# Patient Record
Sex: Male | Born: 1965 | Race: White | Hispanic: No | Marital: Single | Smoking: Current every day smoker
Health system: Southern US, Community
[De-identification: ages and names within clinical notes are randomized; demographics above are authoritative.]

## PROBLEM LIST (undated history)

## (undated) DIAGNOSIS — F209 Schizophrenia, unspecified: Secondary | ICD-10-CM

## (undated) DIAGNOSIS — F329 Major depressive disorder, single episode, unspecified: Secondary | ICD-10-CM

## (undated) DIAGNOSIS — F32A Depression, unspecified: Secondary | ICD-10-CM

## (undated) HISTORY — PX: WRIST SURGERY: SHX841

---

## 2012-09-23 ENCOUNTER — Emergency Department (HOSPITAL_COMMUNITY): Payer: Medicaid Other

## 2012-09-23 ENCOUNTER — Emergency Department (HOSPITAL_COMMUNITY)
Admission: EM | Admit: 2012-09-23 | Discharge: 2012-09-23 | Disposition: A | Discharge status: Home / Self Care | Payer: Medicaid Other | Attending: Emergency Medicine | Admitting: Emergency Medicine

## 2012-09-23 ENCOUNTER — Encounter (HOSPITAL_COMMUNITY): Payer: Self-pay

## 2012-09-23 DIAGNOSIS — S63509A Unspecified sprain of unspecified wrist, initial encounter: Secondary | ICD-10-CM | POA: Insufficient documentation

## 2012-09-23 DIAGNOSIS — Z9889 Other specified postprocedural states: Secondary | ICD-10-CM | POA: Insufficient documentation

## 2012-09-23 DIAGNOSIS — S46212A Strain of muscle, fascia and tendon of other parts of biceps, left arm, initial encounter: Secondary | ICD-10-CM

## 2012-09-23 DIAGNOSIS — F17200 Nicotine dependence, unspecified, uncomplicated: Secondary | ICD-10-CM | POA: Insufficient documentation

## 2012-09-23 DIAGNOSIS — Y929 Unspecified place or not applicable: Secondary | ICD-10-CM | POA: Insufficient documentation

## 2012-09-23 DIAGNOSIS — W010XXA Fall on same level from slipping, tripping and stumbling without subsequent striking against object, initial encounter: Secondary | ICD-10-CM | POA: Insufficient documentation

## 2012-09-23 DIAGNOSIS — IMO0002 Reserved for concepts with insufficient information to code with codable children: Secondary | ICD-10-CM | POA: Insufficient documentation

## 2012-09-23 DIAGNOSIS — Y9389 Activity, other specified: Secondary | ICD-10-CM | POA: Insufficient documentation

## 2012-09-23 DIAGNOSIS — Z8659 Personal history of other mental and behavioral disorders: Secondary | ICD-10-CM | POA: Insufficient documentation

## 2012-09-23 HISTORY — DX: Schizophrenia, unspecified: F20.9

## 2012-09-23 HISTORY — DX: Depression, unspecified: F32.A

## 2012-09-23 HISTORY — DX: Major depressive disorder, single episode, unspecified: F32.9

## 2012-09-23 MED ORDER — IBUPROFEN 800 MG PO TABS: 800.0000 mg | ORAL_TABLET | Freq: Three times a day (TID) | ORAL | Status: AC

## 2012-09-23 NOTE — ED Provider Notes (Addendum)
History    This chart was scribed for Gilda Crease, MD by Quintella Reichert, ED scribe.  This patient was seen in room APA02/[facility]02 and the patient's care was started at 9:51 AM.  CSN: [id number]  Arrival date & time 09/23/12  4098     Chief Complaint  Patient presents with  . Wrist Pain    The history is provided by the patient. No language interpreter was used.    HPI Comments: Gary Duran is a 47 y.o. male who presents to the Emergency Department complaining of a left wrist injury that he sustained yesterday when he tripped over a metal pipe.  Pain is constant and moderate and is exacerbated by moving the wrist.  He denies weakness, numbness or tingling.  Pt denies head impact or LOC in the fall.  He admits to a h/o surgery to that wrist in 2005 and had a plate and screws placed.  Medical history includes schizophrenia and depression.   Past Medical History  Diagnosis Date  . Schizophrenia   . Depression     Past Surgical History  Procedure Laterality Date  . Wrist surgery      No family history on file.   History  Substance Use Topics  . Smoking status: Current Every Day Smoker  . Smokeless tobacco: Not on file  . Alcohol Use: No     Review of Systems  Musculoskeletal: Positive for arthralgias.  Neurological: Negative for syncope, weakness and numbness.  All other systems reviewed and are negative.      Allergies  Review of patient's allergies indicates no known allergies.  Home Medications  No current outpatient prescriptions on file.  BP 149/101  Pulse 64  Temp(Src) 98 F (36.7 C) (Oral)  Resp 20  Ht 6' (1.829 m)  Wt 170 lb (77.111 kg)  BMI 23.05 kg/m2  SpO2 96%  Physical Exam  Nursing note and vitals reviewed. Constitutional: He is oriented to person, place, and time. He appears well-developed and well-nourished. No distress.  HENT:  Head: Normocephalic and atraumatic.  Right Ear: Hearing normal.  Left Ear: Hearing normal.   Nose: Nose normal.  Mouth/Throat: Oropharynx is clear and moist and mucous membranes are normal.  Eyes: Conjunctivae and EOM are normal. Pupils are equal, round, and reactive to light.  Neck: Normal range of motion. Neck supple.  Cardiovascular: Regular rhythm, S1 normal and S2 normal.  Exam reveals no gallop and no friction rub.   No murmur heard. Pulmonary/Chest: Effort normal and breath sounds normal. No respiratory distress. He exhibits no tenderness.  Abdominal: Soft. Normal appearance and bowel sounds are normal. There is no hepatosplenomegaly. There is no tenderness. There is no rebound, no guarding, no tenderness at McBurney's point and negative Murphy's sign. No hernia.  Musculoskeletal: Normal range of motion. He exhibits tenderness.       Arms: Tender across top of wrist. No deformities.  Neurological: He is alert and oriented to person, place, and time. He has normal strength. No cranial nerve deficit or sensory deficit. Coordination normal. GCS eye subscore is 4. GCS verbal subscore is 5. GCS motor subscore is 6.  Skin: Skin is warm, dry and intact. No rash noted. No cyanosis.  Psychiatric: He has a normal mood and affect. His speech is normal and behavior is normal. Thought content normal.     ED Course  Procedures (including critical care time)  DIAGNOSTIC STUDIES: Oxygen Saturation is 96% on room air, normal by my interpretation.    COORDINATION  OF CARE: 9:53 AM-Discussed treatment plan which includes x-ray with pt at bedside and pt agreed to plan.      Labs Reviewed - No data to display  Dg Wrist Complete Left  09/23/2012   *RADIOLOGY REPORT*  Clinical Data: Larey Seat last night with wrist pain, history of prior wrist surgery in 2005  LEFT WRIST - COMPLETE 3+ VIEW  Comparison: None.  Findings: A fixation plate and screws again are noted along the dorsal aspect of the distal left radius.  No acute fracture is seen.  A probable old fracture of the left ulnar styloid is  noted which is well corticated.  Normal alignment is maintained.  No fracture of the carpal bones is seen.  IMPRESSION: No acute fracture.   Original Report Authenticated By: Dwyane Dee, M.D.    Diagnosis: Wrist Sprain    MDM  Presents to the ER for evaluation after a fall. Patient has had previous left wrist surgery. Patient reports that he landed on the left side and his wrist is hurting. X-ray, however, was unremarkable.  Patient does have evidence of a torn bicep muscle belly in the left arm. Patient reports that this injury occurred in 2012 while he was trying to pull himself out of bed after she stomach surgery. Refer to orthopedics.  I personally performed the services described in this documentation, which was scribed in my presence. The recorded information has been reviewed and is accurate.     Gilda Crease, MD 09/23/12 1015  Gilda Crease, MD 09/23/12 1101

## 2012-09-23 NOTE — ED Notes (Signed)
Pt reports fell over a metal pipe yesterday.  C/O pain to left wrist.  No obvious deformity.  Pt has history of surgery to left wrist.  Radial pulse present, sensation intact.  Pt can move fingers but not without pain.

## 2012-09-23 NOTE — ED Notes (Signed)
Patient states he hurt his bicep in 2012 pulling himself up from bed after abd surgery but has not had it checked. Obvious deformity noted. Dr Blinda Leatherwood aware.

## 2014-08-10 IMAGING — CR DG WRIST COMPLETE 3+V*L*
4 series · 4 of 4 positions shown · non-contrast
Comparison: None.

CLINICAL DATA: Fell last night with wrist pain, history of prior
wrist surgery in 1668

LEFT WRIST - COMPLETE 3+ VIEW

[view not recorded (1 of 4)]
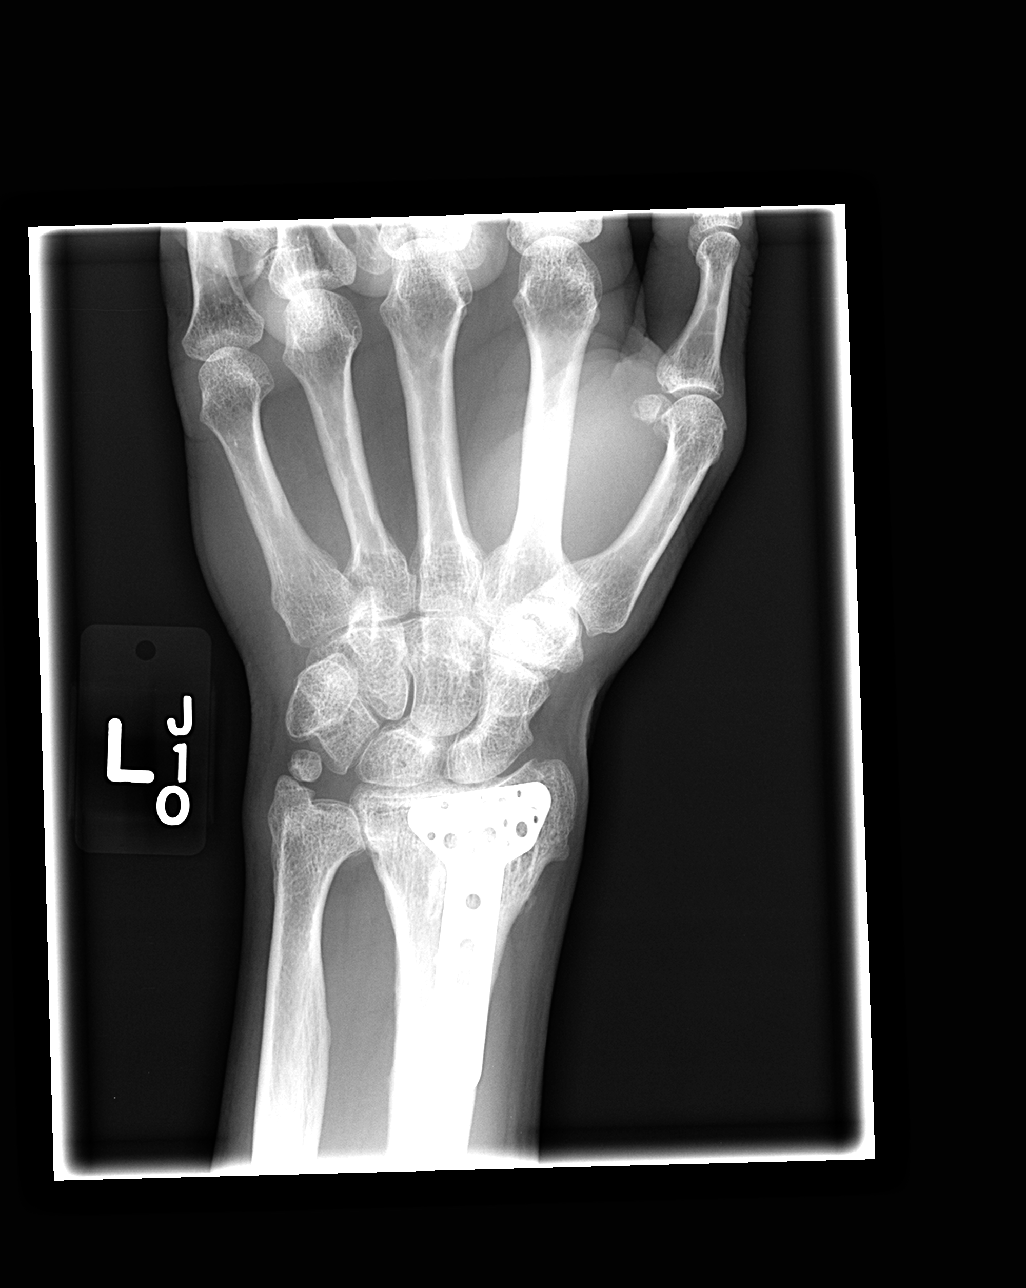

[view not recorded (2 of 4)]
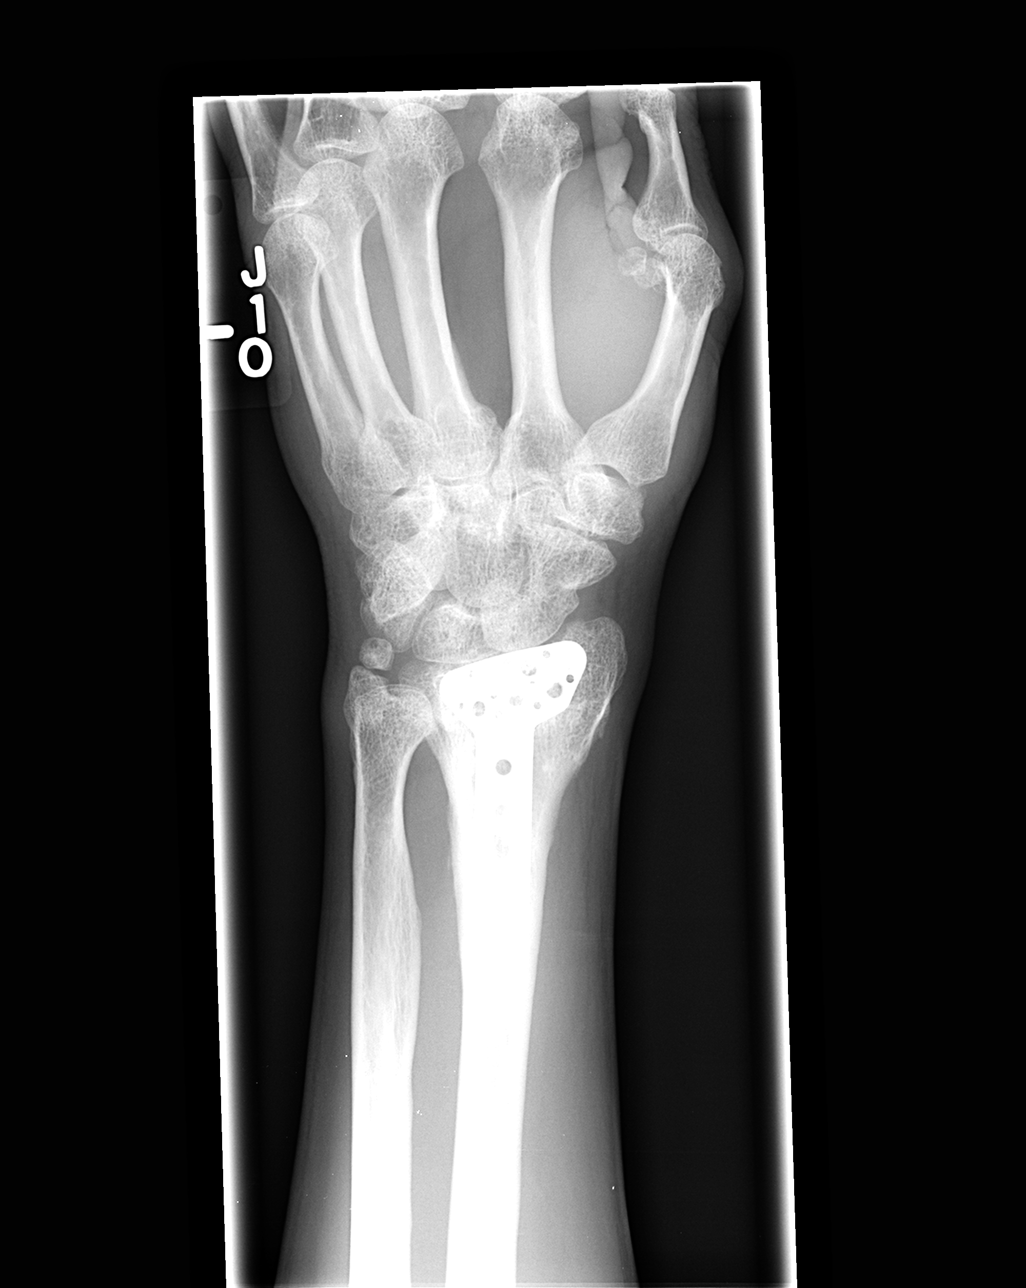

[view not recorded (3 of 4)]
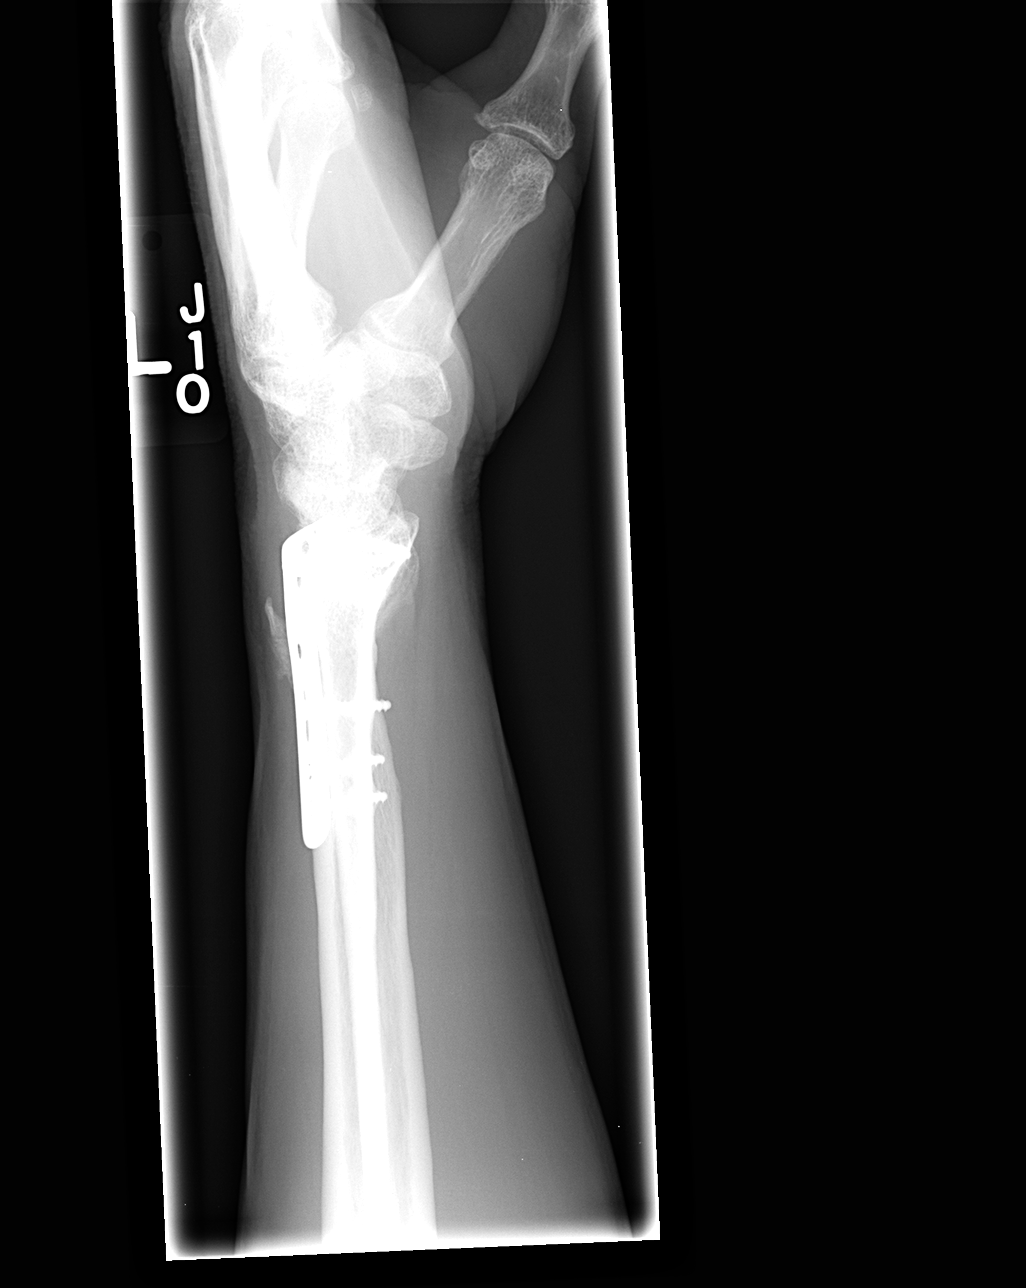

[view not recorded (4 of 4)]
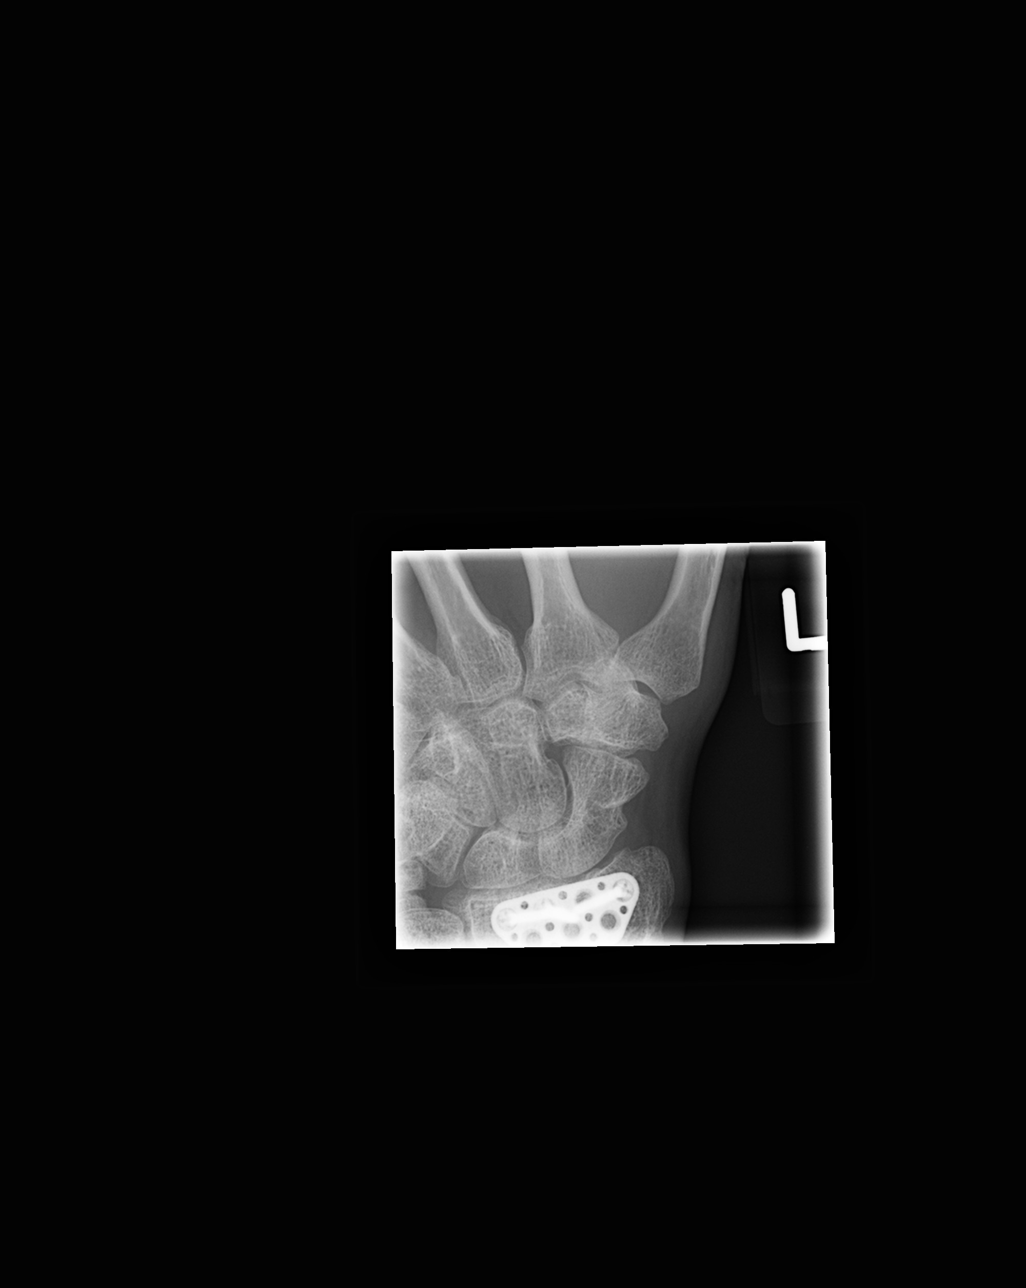

[4 of 4 positions shown; findings below may reference images not displayed]

FINDINGS: A fixation plate and screws again are noted along the
dorsal aspect of the distal left radius.  No acute fracture is
seen.  A probable old fracture of the left ulnar styloid is noted
which is well corticated.  Normal alignment is maintained.  No
fracture of the carpal bones is seen.
IMPRESSION: No acute fracture.

## 2015-04-05 DIAGNOSIS — Z139 Encounter for screening, unspecified: Secondary | ICD-10-CM

## 2015-11-09 NOTE — Congregational Nurse Program (Signed)
Congregational Nurse Program Note  Date of Encounter: 04/05/2015  Past Medical History: Past Medical History:  Diagnosis Date   Depression    Schizophrenia     Encounter Details:  Client seen in [facility] BP 174/98 , pulse 93, needed assistance with follow-up  To PCP Hedda SladePatricia Settle, RN [phone]
